# Patient Record
Sex: Female | Born: 1945 | Race: White | Hispanic: No | Marital: Married | Smoking: Never smoker
Health system: Southern US, Community
[De-identification: ages and names within clinical notes are randomized; demographics above are authoritative.]

## PROBLEM LIST (undated history)

## (undated) DIAGNOSIS — Z8489 Family history of other specified conditions: Secondary | ICD-10-CM

## (undated) DIAGNOSIS — C801 Malignant (primary) neoplasm, unspecified: Secondary | ICD-10-CM

## (undated) DIAGNOSIS — T8859XA Other complications of anesthesia, initial encounter: Secondary | ICD-10-CM

## (undated) DIAGNOSIS — T4145XA Adverse effect of unspecified anesthetic, initial encounter: Secondary | ICD-10-CM

## (undated) DIAGNOSIS — K219 Gastro-esophageal reflux disease without esophagitis: Secondary | ICD-10-CM

## (undated) DIAGNOSIS — T7840XA Allergy, unspecified, initial encounter: Secondary | ICD-10-CM

## (undated) HISTORY — DX: Malignant (primary) neoplasm, unspecified: C80.1

## (undated) HISTORY — DX: Gastro-esophageal reflux disease without esophagitis: K21.9

## (undated) HISTORY — DX: Allergy, unspecified, initial encounter: T78.40XA

---
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
## 1998-12-25 ENCOUNTER — Other Ambulatory Visit: Admission: RE | Admit: 1998-12-25 | Discharge: 1998-12-25 | Payer: Self-pay | Admitting: Obstetrics and Gynecology

## 2000-03-24 ENCOUNTER — Other Ambulatory Visit: Admission: RE | Admit: 2000-03-24 | Discharge: 2000-03-24 | Payer: Self-pay | Admitting: Obstetrics and Gynecology

## 2002-11-30 ENCOUNTER — Other Ambulatory Visit: Admission: RE | Admit: 2002-11-30 | Discharge: 2002-11-30 | Payer: Self-pay | Admitting: Obstetrics and Gynecology

## 2003-03-02 DIAGNOSIS — C801 Malignant (primary) neoplasm, unspecified: Secondary | ICD-10-CM

## 2003-03-02 HISTORY — DX: Malignant (primary) neoplasm, unspecified: C80.1

## 2003-11-22 HISTORY — PX: OTHER SURGICAL HISTORY: SHX169

## 2006-11-15 LAB — HM PAP SMEAR: HM Pap smear: NORMAL

## 2006-12-15 ENCOUNTER — Ambulatory Visit: Payer: Self-pay | Admitting: Gastroenterology

## 2006-12-15 LAB — HM COLONOSCOPY: HM Colonoscopy: NORMAL

## 2008-10-02 ENCOUNTER — Ambulatory Visit: Payer: Self-pay

## 2009-03-01 HISTORY — PX: EYE SURGERY: SHX253

## 2009-09-29 HISTORY — PX: CATARACT EXTRACTION: SUR2

## 2009-10-07 ENCOUNTER — Ambulatory Visit: Payer: Self-pay | Admitting: General Practice

## 2009-10-13 ENCOUNTER — Ambulatory Visit: Payer: Self-pay | Admitting: Ophthalmology

## 2010-10-13 ENCOUNTER — Ambulatory Visit: Payer: Self-pay | Admitting: General Practice

## 2010-11-15 LAB — HM MAMMOGRAPHY: HM Mammogram: NORMAL

## 2011-11-15 ENCOUNTER — Ambulatory Visit (INDEPENDENT_AMBULATORY_CARE_PROVIDER_SITE_OTHER): Payer: No Typology Code available for payment source | Admitting: Internal Medicine

## 2011-11-15 ENCOUNTER — Encounter: Payer: Self-pay | Admitting: Internal Medicine

## 2011-11-15 VITALS — BP 118/68 | HR 76 | Temp 98.5°F | Ht 62.0 in | Wt 154.8 lb

## 2011-11-15 DIAGNOSIS — M545 Low back pain: Secondary | ICD-10-CM

## 2011-11-15 DIAGNOSIS — E785 Hyperlipidemia, unspecified: Secondary | ICD-10-CM | POA: Insufficient documentation

## 2011-11-15 DIAGNOSIS — G8929 Other chronic pain: Secondary | ICD-10-CM

## 2011-11-15 DIAGNOSIS — Z13220 Encounter for screening for lipoid disorders: Secondary | ICD-10-CM

## 2011-11-15 DIAGNOSIS — Z0000 Encounter for general adult medical examination without abnormal findings: Secondary | ICD-10-CM | POA: Insufficient documentation

## 2011-11-15 DIAGNOSIS — Z1239 Encounter for other screening for malignant neoplasm of breast: Secondary | ICD-10-CM

## 2011-11-15 NOTE — Assessment & Plan Note (Signed)
Mild, with LDL 154 and HDL greater than 70 by recent fasting lipids. Review of past year show that the trend is in good direction with HDL increasing.

## 2011-11-15 NOTE — Assessment & Plan Note (Signed)
Her exam is normal except for overweight. She will return for annual GYN exam in 3 months .

## 2011-11-15 NOTE — Progress Notes (Signed)
Patient ID: Felicia Carroll, female   DOB: 06-Nov-1945, 66 y.o.   MRN: [id number]  Patient Active Problem List  Diagnosis  . Routine general medical examination at a health care facility  . Chronic low back pain  . Other and unspecified hyperlipidemia    Subjective:  CC:   Chief Complaint  Patient presents with  . Establish Care    HPI:   Felicia Carroll is a 66 y.o. female who presents as a new patient to establish primary care with the chief complaint of  Mild hyperlipidemia.,  Recent fasting labbs: HDL 47 LDL 156 trigs  111  Total 409.  Has a healthy diet, Walks regularly and gardens,  Keeps grandchildren on the weekends.  2) low back pain and left hip pain which has improved with chiropractic care. It is aggravated by her daytime activities. She Sits at desk all day typing.  Works at the [employer].  uses prn sudafed for hayfever,    Past Medical History  Diagnosis Date  . GERD (gastroesophageal reflux disease)   . Allergy   . Cancer 2005    ,  Olilla removed    Past Surgical History  Procedure Date  . Cataract extraction 8/ 2011  . Melanoma skin cancer 11/22/03  . Eye surgery 2011    cataract,  with lens implant    Family History  Problem Relation Age of Onset  . Cancer Mother   . Arthritis Maternal Grandmother   . Cancer Maternal Grandmother     History   Social History  . Marital Status: Married    Spouse Name: N/A    Number of Children: N/A  . Years of Education: N/A   Occupational History  . Not on file.   Social History Main Topics  . Smoking status: Never Smoker   . Smokeless tobacco: Not on file  . Alcohol Use: No  . Drug Use: No  . Sexually Active: Not on file   Other Topics Concern  . Not on file   Social History Narrative  . No narrative on file    No Known Allergies   Review of Systems:   The remainder of the review of systems was negative except those addressed in the HPI.   Objective:  BP 118/68  Pulse 76  Temp 98.5 F  (36.9 C) (Oral)  Ht 5\' 2"  (1.575 m)  Wt 154 lb 12 oz (70.194 kg)  BMI 28.30 kg/m2  SpO2 96%  General appearance: alert, cooperative and appears stated age Ears: normal TM's and external ear canals both ears Throat: lips, mucosa, and tongue normal; teeth and gums normal Neck: no adenopathy, no carotid bruit, supple, symmetrical, trachea midline and thyroid not enlarged, symmetric, no tenderness/mass/nodules Back: symmetric, no curvature. ROM normal. No CVA tenderness. Lungs: clear to auscultation bilaterally Heart: regular rate and rhythm, S1, S2 normal, no murmur, click, rub or gallop Abdomen: soft, non-tender; bowel sounds normal; no masses,  no organomegaly Pulses: 2+ and symmetric Skin: Skin color, texture, turgor normal. No rashes or lesions Lymph nodes: Cervical, supraclavicular, and axillary nodes normal.  Assessment and Plan:  Routine general medical examination at a health care facility Her exam is normal except for overweight. She will return for annual GYN exam in 3 months .  Screening for lipoid disorders Recent fasting lipids were normal. Her LDL was less than 160 and HDL was is over 70.  Chronic low back pain Nonradiating with prior chiropractic manipulation providing some relief. Pain is aggravated by prolonged  sitting. Reflexes are normal today. Urine nonsteroidal anti-inflammatories.  Other and unspecified hyperlipidemia Mild, with LDL 154 and HDL greater than 70 by recent fasting lipids. Review of past year show that the trend is in good direction with HDL increasing.   Updated Medication List Outpatient Encounter Prescriptions as of 11/15/2011  Medication Sig Dispense Refill  . Chlorpheniramine-Phenylephrine (SUDAFED PE SINUS/ALLERGY) 4-10 MG per tablet Take 1 tablet by mouth every 4 (four) hours as needed.         Orders Placed This Encounter  Procedures  . HM MAMMOGRAPHY  . HM DEXA SCAN  . MM Digital Screening  . HM PAP SMEAR  . HM COLONOSCOPY     Return in about 3 months (around 02/14/2012).

## 2011-11-15 NOTE — Assessment & Plan Note (Signed)
Recent fasting lipids were normal. Her LDL was less than 160 and HDL was is over 70.

## 2011-11-15 NOTE — Assessment & Plan Note (Signed)
Nonradiating with prior chiropractic manipulation providing some relief. Pain is aggravated by prolonged sitting. Reflexes are normal today. Urine nonsteroidal anti-inflammatories.

## 2011-12-01 ENCOUNTER — Ambulatory Visit: Payer: Self-pay | Admitting: General Practice

## 2011-12-07 ENCOUNTER — Encounter: Payer: Self-pay | Admitting: Internal Medicine

## 2012-02-16 ENCOUNTER — Other Ambulatory Visit (HOSPITAL_COMMUNITY)
Admission: RE | Admit: 2012-02-16 | Discharge: 2012-02-16 | Disposition: A | Discharge status: Home / Self Care | Payer: No Typology Code available for payment source | Source: Ambulatory Visit | Attending: Internal Medicine | Admitting: Internal Medicine

## 2012-02-16 ENCOUNTER — Encounter: Payer: Self-pay | Admitting: Internal Medicine

## 2012-02-16 ENCOUNTER — Ambulatory Visit (INDEPENDENT_AMBULATORY_CARE_PROVIDER_SITE_OTHER): Payer: No Typology Code available for payment source | Admitting: Internal Medicine

## 2012-02-16 VITALS — BP 118/66 | HR 67 | Temp 97.7°F | Resp 12 | Ht 62.0 in | Wt 155.2 lb

## 2012-02-16 DIAGNOSIS — Z0000 Encounter for general adult medical examination without abnormal findings: Secondary | ICD-10-CM

## 2012-02-16 DIAGNOSIS — E785 Hyperlipidemia, unspecified: Secondary | ICD-10-CM

## 2012-02-16 DIAGNOSIS — C801 Malignant (primary) neoplasm, unspecified: Secondary | ICD-10-CM | POA: Insufficient documentation

## 2012-02-16 DIAGNOSIS — Z124 Encounter for screening for malignant neoplasm of cervix: Secondary | ICD-10-CM

## 2012-02-16 DIAGNOSIS — Z1151 Encounter for screening for human papillomavirus (HPV): Secondary | ICD-10-CM | POA: Insufficient documentation

## 2012-02-16 DIAGNOSIS — Z01419 Encounter for gynecological examination (general) (routine) without abnormal findings: Secondary | ICD-10-CM | POA: Insufficient documentation

## 2012-02-16 NOTE — Progress Notes (Signed)
Patient ID: Felicia Carroll, female   DOB: 18-May-1945, 66 y.o.   MRN: [id number]  Subjective:     Felicia Carroll is a 66 y.o. female and is here for a comprehensive physical exam. The patient reports no problems. She was treated for dizziness by the office clinic with flonase used prn. Using Simply Saline daily .  Had fasting labs at the office clinic in December     History   Social History  . Marital Status: Married    Spouse Name: N/A    Number of Children: N/A  . Years of Education: N/A   Occupational History  . Not on file.   Social History Main Topics  . Smoking status: Never Smoker   . Smokeless tobacco: Not on file  . Alcohol Use: No  . Drug Use: No  . Sexually Active: Not on file   Other Topics Concern  . Not on file   Social History Narrative  . No narrative on file   Health Maintenance  Topic Date Due  . Tetanus/tdap  02/09/1967  . Zostavax  02/09/2008  . Pap Smear  11/14/2009  . Influenza Vaccine  10/31/2011  . Mammogram  12/16/2013  . Colonoscopy  11/14/2016    The following portions of the patient's history were reviewed and updated as appropriate: allergies, current medications, past family history, past medical history, past social history, past surgical history and problem list.  Review of Systems A comprehensive review of systems was negative.   Objective:        Assessment:    Healthy female exam. BP 118/66  Pulse 67  Temp 97.7 F (36.5 C) (Oral)  Resp 12  Ht 5\' 2"  (1.575 m)  Wt 155 lb 4 oz (70.421 kg)  BMI 28.40 kg/m2  SpO2 97%  General Appearance:    Alert, cooperative, no distress, appears stated age  Head:    Normocephalic, without obvious abnormality, atraumatic  Eyes:    PERRL, conjunctiva/corneas clear, EOM's intact, fundi    benign, both eyes  Ears:    Normal TM's and external ear canals, both ears  Nose:   Nares normal, septum midline, mucosa normal, no drainage    or sinus tenderness  Throat:   Lips, mucosa, and tongue normal;  teeth and gums normal  Neck:   Supple, symmetrical, trachea midline, no adenopathy;    thyroid:  no enlargement/tenderness/nodules; no carotid   bruit or JVD  Back:     Symmetric, no curvature, ROM normal, no CVA tenderness  Lungs:     Clear to auscultation bilaterally, respirations unlabored  Chest Wall:    No tenderness or deformity   Heart:    Regular rate and rhythm, S1 and S2 normal, no murmur, rub   or gallop  Breast Exam:    No tenderness, masses, or nipple abnormality  Abdomen:     Soft, non-tender, bowel sounds active all four quadrants,    no masses, no organomegaly  Genitalia:    Normal female without lesion, discharge or tenderness  Rectal:    Normal tone, normal prostate, no masses or tenderness;   guaiac negative stool  Extremities:   Extremities normal, atraumatic, no cyanosis or edema  Pulses:   2+ and symmetric all extremities  Skin:   Skin color, texture, turgor normal, no rashes or lesions  Lymph nodes:   Cervical, supraclavicular, and axillary nodes normal  Neurologic:   CNII-XII intact, normal strength, sensation and reflexes    throughout  Assessment and Plan  Routine general medical examination at a health care facility Breast pelvic and PAP smear done today.   Other and unspecified hyperlipidemia Mild, with elevated HDL,  No indication for medications.    Updated Medication List Outpatient Encounter Prescriptions as of 02/16/2012  Medication Sig Dispense Refill  . fluticasone (FLONASE) 50 MCG/ACT nasal spray Place 2 sprays into the nose daily.      . Chlorpheniramine-Phenylephrine (SUDAFED PE SINUS/ALLERGY) 4-10 MG per tablet Take 1 tablet by mouth every 4 (four) hours as needed.

## 2012-02-16 NOTE — Patient Instructions (Addendum)
Please ask your office to refax your recent labs  [phone] attn Dr Darrick Huntsman  Exercise goal : 30 minutes of aerobics 5 days week  This is  my version of a  "Low GI"  Diet:  All of the foods can be found at grocery stores and in bulk at [employer].  The Atkins protein bars and shakes are available in more varieties at Target, [employer] and [employer].     7 AM Breakfast:  Low carbohydrate Protein  Shakes (I recommend the EAS AdvantEdge "Carb Control" shakes  Or the low carb shakes by Atkins.   Both are available everywhere:  In  cases at [employer]Js  Or in 4 packs at grocery stores and pharmacies  2.5 carbs  (Alternative is  a toasted Arnold's Sandwhich Thin w/ peanut butter, a "Bagel Thin" with cream cheese and salmon) or  a scrambled egg burrito made with a low carb tortilla .  Avoid cereal and bananas, oatmeal too unless you are cooking the old fashioned kind that takes 30-40 minutes to prepare.  the rest is overly processed, has minimal fiber, and is loaded with carbohydrates!   10 AM: Protein bar by Atkins (the snack size, under 200 cal).  There are many varieties , available widely again or in bulk in limited varieties at BJs)  Other so called "protein bars" tend to be loaded with carbohydrates.  Remember, in food advertising, the word "energy" is synonymous for " carbohydrate."  Lunch: sandwich of [place], (or any lunchmeat, grilled meat or canned tuna), fresh avocado, mayonnaise  and cheese on a lower carbohydrate pita bread, flatbread, or tortilla . Ok to use regular mayonnaise. The bread is the only source or carbohydrate that can be decreased (Joseph's makes a pita bread and a flat bread that are 50 cal and 4 net carbs ; Toufayan makes a low carb flatbread that's 100 cal and 9 net carbs  and  Mission makes a low carb whole wheat tortilla  That is 210 cal and 6 net carbs)  3 PM:  Mid day :  Another protein bar,  Or a  cheese stick (100 cal, 0 carbs),  Or 1 ounce of  almonds, walnuts, pistachios, pecans,  peanuts,  Macadamia nuts. Or a Dannon light n Fit greek yogurt, 80 cal 8 net carbs . Avoid "granola"; the dried cranberries and raisins are loaded with carbohydrates. Mixed nuts ok if no raisins or cranberries or dried fruit.      6 PM  Dinner:  "mean and green:"  Meat/chicken/fish or a high protein legume; , with a green salad, and a low GI  Veggie (broccoli, cauliflower, green beans, spinach, brussel sprouts. Lima beans) : Avoid "Low fat dressings, as well as Reyne Dumas and [address]! They are loaded with sugar! Instead use ranch, vinagrette,  Blue cheese, etc.  There is a low carb pasta by Dreamfield's available at [facility] that is acceptable and tastes great. Try Michel Angel's chicken piccata over low carb pasta. The chicken dish is 0 carbs, and can be found in frozen section at BJs and Lowe's. Also try [employer] "Carnitas" (pulled pork, no sauce,  0 carbs) and his pot roast.   both are in the refrigerated section at BJs   9 PM snack : Breyer's "low carb" fudgsicle or  ice cream bar (Carb Smart line), or  Weight Watcher's ice cream bar , or another "no sugar added" ice cream;a serving of fresh berries/cherries with whipped cream (Avoid bananas,  pineapple, grapes  and watermelon on a regular basis because they are high in sugar)   Remember that snack Substitutions should be less than 15 to 20 carbs  Per serving. Remember to subtract fiber grams and sugar alcohols to get the "net carbs."

## 2012-02-20 ENCOUNTER — Encounter: Payer: Self-pay | Admitting: Internal Medicine

## 2012-02-20 NOTE — Assessment & Plan Note (Signed)
Mild, with elevated HDL,  No indication for medications.

## 2012-02-20 NOTE — Assessment & Plan Note (Signed)
Breast pelvic and PAP smear done today 

## 2012-02-23 ENCOUNTER — Telehealth: Payer: Self-pay | Admitting: Internal Medicine

## 2012-02-23 NOTE — Telephone Encounter (Signed)
I have received her recent labs.  The only abnormal was slightly elevated cholesterol, which does not need medication at this time. Repeat annually,  Try to follow a  Mediterranean diet. (she can google this phrase and read all about it)  Please also change her name in the system to "Christan" so we can find her more easily when things are sent in. thanks

## 2012-02-24 NOTE — Telephone Encounter (Signed)
Pt notified of the lab results.

## 2012-04-15 ENCOUNTER — Other Ambulatory Visit: Payer: Self-pay

## 2013-01-04 ENCOUNTER — Other Ambulatory Visit: Payer: Self-pay

## 2013-10-25 ENCOUNTER — Ambulatory Visit: Payer: Self-pay | Admitting: General Practice

## 2014-11-29 ENCOUNTER — Other Ambulatory Visit: Payer: Self-pay

## 2014-11-29 ENCOUNTER — Ambulatory Visit: Payer: Self-pay | Admitting: Physician Assistant

## 2014-11-29 ENCOUNTER — Other Ambulatory Visit: Payer: Self-pay | Admitting: Family Medicine

## 2014-11-29 ENCOUNTER — Encounter: Payer: Self-pay | Admitting: Physician Assistant

## 2014-11-29 VITALS — BP 120/70 | HR 71 | Temp 97.7°F

## 2014-11-29 DIAGNOSIS — Z0000 Encounter for general adult medical examination without abnormal findings: Secondary | ICD-10-CM

## 2014-11-29 NOTE — Addendum Note (Signed)
Addended by: Versie Starks on: 11/29/2014 02:31 PM   Modules accepted: Orders

## 2014-11-29 NOTE — Progress Notes (Signed)
S:  Pt here for yearly labs, hasn't seen pcp in a while, no medical hx, no complaints, ros neg; no baseline ekg in our chart, baseline ekg at [facility] is several years old, nonsmoker  O: vitals wnl,  General: No apparent distress    Ent:  tms clear, nasal mucosa pink, throat wnl, neck supple no lymph Lungs: Normal effort. Lungs are clear to auscultation, no crackles or wheezes Cardiovascular: Regular rate and rhythm, no edema Musculoskeletal:  Neurovascularly intact Neurological: No new neurological deficits  A: well adult   P: baseline ekg , fasting labs to include exec panel and vit d

## 2014-11-30 LAB — CMP12+LP+TP+TSH+6AC+CBC/D/PLT
A/G RATIO: 1.6 (ref 1.1–2.5)
ALK PHOS: 99 IU/L (ref 39–117)
ALT: 15 IU/L (ref 0–32)
AST: 22 IU/L (ref 0–40)
Albumin: 3.9 g/dL (ref 3.6–4.8)
BASOS ABS: 0 10*3/uL (ref 0.0–0.2)
BASOS: 0 %
BILIRUBIN TOTAL: 0.3 mg/dL (ref 0.0–1.2)
BUN / CREAT RATIO: 16 (ref 11–26)
BUN: 16 mg/dL (ref 8–27)
CHLORIDE: 101 mmol/L (ref 97–108)
Calcium: 9.4 mg/dL (ref 8.7–10.3)
Chol/HDL Ratio: 5.3 ratio units — ABNORMAL HIGH (ref 0.0–4.4)
Cholesterol, Total: 252 mg/dL — ABNORMAL HIGH (ref 100–199)
Creatinine, Ser: 1 mg/dL (ref 0.57–1.00)
EOS (ABSOLUTE): 0.1 10*3/uL (ref 0.0–0.4)
EOS: 2 %
Estimated CHD Risk: 1.4 times avg. — ABNORMAL HIGH (ref 0.0–1.0)
FREE THYROXINE INDEX: 2.3 (ref 1.2–4.9)
GFR calc non Af Amer: 59 mL/min/{1.73_m2} — ABNORMAL LOW (ref >59)
GFR, EST AFRICAN AMERICAN: 68 mL/min/{1.73_m2} (ref >59)
GGT: 12 IU/L (ref 0–60)
GLUCOSE: 88 mg/dL (ref 65–99)
Globulin, Total: 2.4 g/dL (ref 1.5–4.5)
HDL: 48 mg/dL (ref >39)
HEMATOCRIT: 38.5 % (ref 34.0–46.6)
HEMOGLOBIN: 12.6 g/dL (ref 11.1–15.9)
IMMATURE GRANS (ABS): 0 10*3/uL (ref 0.0–0.1)
IMMATURE GRANULOCYTES: 0 %
IRON: 77 ug/dL (ref 27–139)
LDH: 163 IU/L (ref 119–226)
LDL CALC: 183 mg/dL — AB (ref 0–99)
Lymphocytes Absolute: 2.3 10*3/uL (ref 0.7–3.1)
Lymphs: 40 %
MCH: 29.3 pg (ref 26.6–33.0)
MCHC: 32.7 g/dL (ref 31.5–35.7)
MCV: 90 fL (ref 79–97)
MONOCYTES: 8 %
Monocytes Absolute: 0.4 10*3/uL (ref 0.1–0.9)
NEUTROS PCT: 50 %
Neutrophils Absolute: 2.9 10*3/uL (ref 1.4–7.0)
PLATELETS: 219 10*3/uL (ref 150–379)
Phosphorus: 3.5 mg/dL (ref 2.5–4.5)
Potassium: 4.5 mmol/L (ref 3.5–5.2)
RBC: 4.3 x10E6/uL (ref 3.77–5.28)
RDW: 14.2 % (ref 12.3–15.4)
Sodium: 141 mmol/L (ref 134–144)
T3 UPTAKE RATIO: 26 % (ref 24–39)
T4, Total: 8.8 ug/dL (ref 4.5–12.0)
TOTAL PROTEIN: 6.3 g/dL (ref 6.0–8.5)
TSH: 3.55 u[IU]/mL (ref 0.450–4.500)
Triglycerides: 106 mg/dL (ref 0–149)
URIC ACID: 6 mg/dL (ref 2.5–7.1)
VLDL CHOLESTEROL CAL: 21 mg/dL (ref 5–40)
WBC: 5.8 10*3/uL (ref 3.4–10.8)

## 2014-11-30 LAB — VITAMIN D 25 HYDROXY (VIT D DEFICIENCY, FRACTURES): Vit D, 25-Hydroxy: 35.1 ng/mL (ref 30.0–100.0)

## 2016-03-01 HISTORY — PX: LUMBAR LAMINECTOMY: SHX95

## 2016-05-24 DIAGNOSIS — C44519 Basal cell carcinoma of skin of other part of trunk: Secondary | ICD-10-CM | POA: Diagnosis not present

## 2016-05-24 DIAGNOSIS — Z85828 Personal history of other malignant neoplasm of skin: Secondary | ICD-10-CM | POA: Diagnosis not present

## 2016-05-24 DIAGNOSIS — L578 Other skin changes due to chronic exposure to nonionizing radiation: Secondary | ICD-10-CM | POA: Diagnosis not present

## 2016-05-24 DIAGNOSIS — Z1283 Encounter for screening for malignant neoplasm of skin: Secondary | ICD-10-CM | POA: Diagnosis not present

## 2016-05-24 DIAGNOSIS — Z85820 Personal history of malignant melanoma of skin: Secondary | ICD-10-CM | POA: Diagnosis not present

## 2016-05-24 DIAGNOSIS — D485 Neoplasm of uncertain behavior of skin: Secondary | ICD-10-CM | POA: Diagnosis not present

## 2016-08-17 DIAGNOSIS — M545 Low back pain: Secondary | ICD-10-CM | POA: Diagnosis not present

## 2016-08-31 DIAGNOSIS — M5416 Radiculopathy, lumbar region: Secondary | ICD-10-CM | POA: Diagnosis not present

## 2016-08-31 DIAGNOSIS — M48061 Spinal stenosis, lumbar region without neurogenic claudication: Secondary | ICD-10-CM | POA: Diagnosis not present

## 2016-08-31 DIAGNOSIS — M4316 Spondylolisthesis, lumbar region: Secondary | ICD-10-CM | POA: Diagnosis not present

## 2016-08-31 DIAGNOSIS — M5126 Other intervertebral disc displacement, lumbar region: Secondary | ICD-10-CM | POA: Diagnosis not present

## 2016-08-31 DIAGNOSIS — M5136 Other intervertebral disc degeneration, lumbar region: Secondary | ICD-10-CM | POA: Diagnosis not present

## 2016-09-21 DIAGNOSIS — M4316 Spondylolisthesis, lumbar region: Secondary | ICD-10-CM | POA: Diagnosis not present

## 2016-09-22 DIAGNOSIS — C44519 Basal cell carcinoma of skin of other part of trunk: Secondary | ICD-10-CM | POA: Diagnosis not present

## 2016-09-29 ENCOUNTER — Encounter: Payer: Self-pay | Admitting: Physical Therapy

## 2016-09-29 ENCOUNTER — Ambulatory Visit: Payer: Medicare Other | Attending: Neurosurgery | Admitting: Physical Therapy

## 2016-09-29 ENCOUNTER — Ambulatory Visit: Payer: Medicare Other

## 2016-09-29 DIAGNOSIS — G8929 Other chronic pain: Secondary | ICD-10-CM | POA: Insufficient documentation

## 2016-09-29 DIAGNOSIS — M6281 Muscle weakness (generalized): Secondary | ICD-10-CM | POA: Diagnosis not present

## 2016-09-29 DIAGNOSIS — M5440 Lumbago with sciatica, unspecified side: Secondary | ICD-10-CM | POA: Insufficient documentation

## 2016-09-29 NOTE — Therapy (Signed)
[facility] [address], [place], [address] Phone: [phone]   Fax:  [phone]  Physical Therapy Evaluation  Patient Details  Name: Felicia Carroll MRN: [id number] Date of Birth: 05-Nov-1945 Referring Provider: Virginia Crews MD  Encounter Date: 09/29/2016      PT End of Session - 09/29/16 1000    Visit Number 1   Number of Visits 12   Date for PT Re-Evaluation 11/10/16   Authorization Type 1   Authorization Time Period 10 G code   PT Start Time 0905   PT Stop Time 1000   PT Time Calculation (min) 55 min   Activity Tolerance Patient tolerated treatment well   Behavior During Therapy [facility] for tasks assessed/performed      Past Medical History:  Diagnosis Date  . Allergy   . Cancer ([place]) 2005   melanoma  Olilla removed  . GERD (gastroesophageal reflux disease)     Past Surgical History:  Procedure Laterality Date  . CATARACT EXTRACTION  8/ 2011  . EYE SURGERY  2011   cataract,  with lens implant  . melanoma skin cancer  11/22/03    There were no vitals filed for this visit.       Subjective Assessment - 09/29/16 0920    Subjective Patient reports she is having lower back pain that limits prolonged standing; sitting is better than standing; repetitive bending aggravates back pain as well. 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 She also works    Pertinent History long history of back pain; stretching and exercising for self managment    Limitations Standing;Walking;House hold activities   Patient Stated Goals to be able to improve function with daily chores and walking for exercise   Currently in Pain? Yes   Pain Score 2    Pain Location Back   Pain Orientation Lower   Pain Descriptors / Indicators Aching;Dull   Pain Type Chronic pain   Pain Onset More than a month ago   Pain Frequency Constant   Aggravating Factors  walking, sitting   Pain  Relieving Factors rest   Effect of Pain on Daily Activities limits exercising, walking activities            [facility] PT Assessment - 09/29/16 2050      Assessment   Medical Diagnosis spinal stenosis of lumbar region, unspecified whether neurogenic claudicaiton present; Spondylolisthesis of lumbar region   Referring Provider Virginia Crews MD   Onset Date/Surgical Date 03/01/14   Prior Therapy none     Precautions   Precautions None     Balance Screen   Has the patient fallen in the past 6 months No     [place] residence   Living Arrangements Spouse/significant other   Type of [place] to enter   Entrance Stairs-Number of Steps 4   Entrance Stairs-[place]ails [place] One level     Prior Function   Level of Independence Independent   Vocation Retired   Leisure boating, gardening     Cognition   Overall Cognitive Status Within Functional Limits for tasks assessed     Observation/Other Assessments   Modified [place] 32% (moderate self perceived disability)     Sensation   Light Touch Appears Intact     Posture/Postural Control   Posture Comments guarded  ROM / Strength   AROM / PROM / Strength AROM;Strength     AROM   Overall AROM Comments lumbar spine: flexion WFL wihtout reproduction of pain, extension limited at least 50%; side bend right and left WFL and equal bilaterally; LE's hip flexion WNL, extension limited; ER mild limitations bilaterally,; knee flexion/extension WNL     Strength   Overall Strength Comments LE's: hip flexion right 5/5, left 4/5; knee extension right 5/5, left 4/5; hip extension 3+/5 bilateral.; ankle DF and great toe extenison, knee flexion equal bilaterally 5/5     FABER test   findings Negative   Comment bilateral     Straight Leg Raise   Findings Negative   Comment bilateral      Objective measurements completed on examination: See above findings.             PT Education - 09/29/16 0933    Education provided Yes   Education Details POC, body mechanics, exercises for stabilization in sitting   Person(s) Educated Patient   Methods Explanation;Demonstration;Verbal cues;Handout   Comprehension Verbalized understanding;Returned demonstration;Verbal cues required             PT Long Term Goals - 09/29/16 1048      PT LONG TERM GOAL #1   Title Patient will demonstrate improved function with daily tasks and decreased back pain as indicated by MODI score of  25% or less   Baseline MODI 32%   Status New   Target Date 11/10/16     PT LONG TERM GOAL #2   Title Patient will demonstrate improved posture awareness and pain control strategies to allow patient to stand for >30 min.    Baseline unable to stand >10 min   Status New   Target Date 11/10/16     PT LONG TERM GOAL #3   Title Patient will be independent with home program for posture awareness, pain control, progressive exercises to allow patient to transition to self management once discharged from physical therapy   Baseline limited to no knowledge of good body mechanics, posture awareness or exercise progression without guidance, cuing   Status New   Target Date 11/10/16                Plan - 09/29/16 1004    Clinical Impression Statement Patient is a 71 year old female who presents with chronic low back pain with left LE weakness and pain. She has limitations with walking and standing which limits ability to perform household chores and community activities. Her MODI score of 32% indicates moderate self perceived disabiltiy. She has no knowledge of appropirate pain control strategies, exercises or progression and will benefit from physical therapy intervention to improve function.    History and Personal Factors relevant to plan of care: long history of progressive worseing of low back pain with weakness in left LE   Clinical Presentation Evolving   Clinical  Presentation due to: worseingin symptoms over the past 2 years with pain radiating into left LE with weakness   Clinical Decision Making Moderate   Rehab Potential Fair   Clinical Impairments Affecting Rehab Potential (+)motivated(-)chronic condition, multiple comorbidities   PT Frequency 2x / week   PT Duration 6 weeks   PT Treatment/Interventions Electrical Stimulation;Cryotherapy;Moist Heat;Ultrasound;Patient/family education;Neuromuscular re-education;Therapeutic exercise;Manual techniques   PT Next Visit Plan pain control, manual therapy, progressive exercises   PT Home Exercise Plan body mechanics for daily tasks, stabilization sitting hip adduction with ball and glute sets; glute sets standing,  ball rolling under foot in sitting   Consulted and Agree with Plan of Care Patient      Patient will benefit from skilled therapeutic intervention in order to improve the following deficits and impairments:  Decreased strength, Improper body mechanics, Pain, Impaired perceived functional ability, Decreased activity tolerance, Increased muscle spasms, Difficulty walking, Decreased range of motion  Visit Diagnosis: Chronic low back pain with sciatica, sciatica laterality unspecified, unspecified back pain laterality - Plan: PT plan of care cert/re-cert  Muscle weakness (generalized) - Plan: PT plan of care cert/re-cert      G-Codes - 32/20/25 1000    Functional Assessment Tool Used (Outpatient Only) MODI, strength, ROM, pain, clinical judgment   Functional Limitation Mobility: Walking and moving around   Mobility: Walking and Moving Around Current Status ([id number]) At least 40 percent but less than 60 percent impaired, limited or restricted   Mobility: Walking and Moving Around Goal Status [phone]) At least 20 percent but less than 40 percent impaired, limited or restricted       Problem List Patient Active Problem List   Diagnosis Date Noted  . Cancer ([place])   . Routine general medical  examination at a health care facility 11/15/2011  . Chronic low back pain 11/15/2011  . Other and unspecified hyperlipidemia 11/15/2011    Jomarie Longs PT 09/30/2016, 11:04 AM  [facility] [address], [place], [address] Phone: [phone]   Fax:  [phone]  Name: Felicia Carroll MRN: [id number] Date of Birth: 14-May-1945

## 2016-10-06 ENCOUNTER — Encounter: Payer: No Typology Code available for payment source | Admitting: Physical Therapy

## 2016-10-07 ENCOUNTER — Encounter: Payer: Self-pay | Admitting: Physical Therapy

## 2016-10-07 ENCOUNTER — Ambulatory Visit: Payer: Medicare Other | Admitting: Physical Therapy

## 2016-10-07 DIAGNOSIS — G8929 Other chronic pain: Secondary | ICD-10-CM

## 2016-10-07 DIAGNOSIS — M6281 Muscle weakness (generalized): Secondary | ICD-10-CM

## 2016-10-07 DIAGNOSIS — M5440 Lumbago with sciatica, unspecified side: Secondary | ICD-10-CM

## 2016-10-07 NOTE — Therapy (Signed)
[facility] [address] [place], [address] Phone: [phone]   Fax:  [phone]  Physical Therapy Treatment  Patient Details  Name: Felicia Carroll MRN: [id number] Date of Birth: 09/14/1945 Referring Provider: Virginia Crews MD  Encounter Date: 10/07/2016      PT End of Session - 10/07/16 0910    Visit Number 2   Number of Visits 12   Date for PT Re-Evaluation 11/10/16   Authorization Type 2   Authorization Time Period 10 G code   PT Start Time 0903   PT Stop Time 0945   PT Time Calculation (min) 42 min   Activity Tolerance Patient tolerated treatment well   Behavior During Therapy [facility] for tasks assessed/performed      Past Medical History:  Diagnosis Date  . Allergy   . Cancer ([place]) 2005   melanoma  Olilla removed  . GERD (gastroesophageal reflux disease)     Past Surgical History:  Procedure Laterality Date  . CATARACT EXTRACTION  8/ 2011  . EYE SURGERY  2011   cataract,  with lens implant  . melanoma skin cancer  11/22/03    There were no vitals filed for this visit.      Subjective Assessment - 10/07/16 0904    Subjective Patient reports she is working on [employer] and had a long day yesterday and also did yard work and house work and her bac is sore from the activity. she is taking ibuprofen prior to bed and this is helping with her sleeping better.    Pertinent History long history of back pain; stretching and exercising for self managment    Limitations Standing;Walking;House hold activities   Patient Stated Goals to be able to improve function with daily chores and walking for exercise   Currently in Pain? Yes   Pain Score 3    Pain Location Back   Pain Orientation Lower;Right   Pain Descriptors / Indicators Aching;Dull   Pain Type Chronic pain   Pain Onset More than a month ago   Pain Frequency Constant     Objective: Posture: improved sitting posture with  more erect spine  Treatment: Therapeutic exercise: patient performed exercises with verbal, tactile cues and demonstration of therapist: goals: independent with home program, strength, improve function Sitting: Hip adduction with ball with glute sets x 10 reps, 3-5 second holds with VC for correct technique /sequence Hip abduction with green resistive band x 20 reps Standing: Resistive band, green, scapular rows x 15 reps high/mid rows Straight arm pull downs to hips green resistive band x 15 reps Side stepping along counter for UE support, green resistive band around thighs x 1 min. Diagonal march with 4# weights in hands x 1 min.  Modalities: Electrical stimulation: 15 min.: High volt estim.clincial program for muscle spasms  (4) electrodes applied to bilateral lumbar spine intensity to tolerance with patient prone lying over 1 pillow and LE's supported by pillow; moist heat applied in conjunction with estim. No adverse reactions noted: goal: pain, spasms  Patient response to treatment: patient demonstrated improved technique with exercises with minimal to moderate VC for correct alignment. Patient with decreased pain from   3/10 to <1 /10. Improved motor control with repetition and cuing          PT Education - 10/07/16 0908    Education provided Yes   Education Details re assessed home exercises, instruction in proper technique, sequence and alignment with  exercises: added diagonal walk, scapular rows, side stepping with resistive band   Person(s) Educated Patient   Methods Explanation;Demonstration;Verbal cues   Comprehension Verbalized understanding;Returned demonstration;Verbal cues required             PT Long Term Goals - 09/29/16 1048      PT LONG TERM GOAL #1   Title Patient will demonstrate improved function with daily tasks and decreased back pain as indicated by MODI score of  25% or less   Baseline MODI 32%   Status New   Target Date 11/10/16     PT LONG  TERM GOAL #2   Title Patient will demonstrate improved posture awareness and pain control strategies to allow patient to stand for >30 min.    Baseline unable to stand >10 min   Status New   Target Date 11/10/16     PT LONG TERM GOAL #3   Title Patient will be independent with home program for posture awareness, pain control, progressive exercises to allow patient to transition to self management once discharged from physical therapy   Baseline limited to no knowledge of good body mechanics, posture awareness or exercise progression without guidance, cuing   Status New   Target Date 11/10/16               Plan - 10/07/16 1017    Clinical Impression Statement Patient is progressing with exercises and knowledge of self managment of pain, body mechanics. She continues with increased pain with increased activity. She will benefit from additional physical therapy intervention to address limitations and achieve goals.    Rehab Potential Fair   Clinical Impairments Affecting Rehab Potential (+)motivated(-)chronic condition, multiple comorbidities   PT Frequency 2x / week   PT Duration 6 weeks   PT Treatment/Interventions Electrical Stimulation;Cryotherapy;Moist Heat;Ultrasound;Patient/family education;Neuromuscular re-education;Therapeutic exercise;Manual techniques   PT Next Visit Plan pain control, manual therapy, progressive exercises   PT Home Exercise Plan body mechanics for daily tasks, stabilization sitting hip adduction with ball and glute sets; glute sets standing, ball rolling under foot in sitting      Patient will benefit from skilled therapeutic intervention in order to improve the following deficits and impairments:  Decreased strength, Improper body mechanics, Pain, Impaired perceived functional ability, Decreased activity tolerance, Increased muscle spasms, Difficulty walking, Decreased range of motion  Visit Diagnosis: Muscle weakness (generalized)  Chronic low back  pain with sciatica, sciatica laterality unspecified, unspecified back pain laterality     Problem List Patient Active Problem List   Diagnosis Date Noted  . Cancer ([place])   . Routine general medical examination at a health care facility 11/15/2011  . Chronic low back pain 11/15/2011  . Other and unspecified hyperlipidemia 11/15/2011    Jomarie Longs PT 10/07/2016, 10:19 AM  [facility] [facility] [address] [place], [address] Phone: [phone]   Fax:  [phone]  Name: Felicia Carroll MRN: [id number] Date of Birth: Oct 30, 1945

## 2016-10-11 ENCOUNTER — Encounter: Payer: Self-pay | Admitting: Physical Therapy

## 2016-10-11 ENCOUNTER — Ambulatory Visit: Payer: Medicare Other | Admitting: Physical Therapy

## 2016-10-11 DIAGNOSIS — M5440 Lumbago with sciatica, unspecified side: Secondary | ICD-10-CM | POA: Diagnosis not present

## 2016-10-11 DIAGNOSIS — G8929 Other chronic pain: Secondary | ICD-10-CM

## 2016-10-11 DIAGNOSIS — M6281 Muscle weakness (generalized): Secondary | ICD-10-CM

## 2016-10-11 NOTE — Therapy (Signed)
[facility] [facility] [address] [place], [address] Phone: [phone]   Fax:  [phone]  Physical Therapy Treatment  Patient Details  Name: Felicia Carroll MRN: [id number] Date of Birth: Sep 04, 1945 Referring Provider: Virginia Crews MD  Encounter Date: 10/11/2016      PT End of Session - 10/11/16 1632    Visit Number 3   Number of Visits 12   Date for PT Re-Evaluation 11/10/16   Authorization Type 3   Authorization Time Period 10 G code   PT Start Time 1600   PT Stop Time 1640   PT Time Calculation (min) 40 min   Activity Tolerance Patient tolerated treatment well   Behavior During Therapy [facility] for tasks assessed/performed      Past Medical History:  Diagnosis Date  . Allergy   . Cancer ([place]) 2005   melanoma  Olilla removed  . GERD (gastroesophageal reflux disease)     Past Surgical History:  Procedure Laterality Date  . CATARACT EXTRACTION  8/ 2011  . EYE SURGERY  2011   cataract,  with lens implant  . melanoma skin cancer  11/22/03    There were no vitals filed for this visit.      Subjective Assessment - 10/11/16 1602    Subjective Patient reports she is having increased soreness in her hips/buttock region   Pertinent History long history of back pain; stretching and exercising for self managment    Limitations Standing;Walking;House hold activities   Patient Stated Goals to be able to improve function with daily chores and walking for exercise   Currently in Pain? Yes   Pain Score 5    Pain Location Back   Pain Orientation Lower   Pain Descriptors / Indicators Aching;Sore   Pain Type Chronic pain   Pain Onset More than a month ago   Pain Frequency Constant        Objective: Posture: improving posture awareness with sitting/standing  Treatment: Therapeutic exercise: patient performed exercises with verbal, tactile cues and demonstration of therapist: goals: independent  with home program, strength, improve function Sitting: Hip adduction with ball with glute sets x 10 reps, 3-5 second holds with VC for correct technique /sequence Hip abduction with green resistive band x 20 reps At [employer] cable exercise machine: Sitting on stability ball with base for support: Scapular rows bilateral 10# x 15 reps Straight arm pull downs 15# x 15 reps palloff press forward to abdomen 15# x 15 reps 4# weight stabilization bilateral forward flexion up over head x 10 Rhythmic stabilization with manual resistance of therapist flexion/extension x 10 reps, 5 second holds  Moist heat applied to back with patient seated at end of exercises for pain/spasms: unbilled time; no adverse reactions noted  Patient response to treatment: patient demonstrated improved motor control stabilization on ball with minimal cuing, repetition. Patient reported decreased soreness in lower back at end of session following moist heat.         PT Education - 10/11/16 1631    Education provided Yes   Education Details instruction in exercises with good technique, positioning   Person(s) Educated Patient   Methods Explanation;Demonstration;Verbal cues   Comprehension Verbalized understanding;Returned demonstration;Verbal cues required             PT Long Term Goals - 09/29/16 1048      PT LONG TERM GOAL #1   Title Patient will demonstrate improved function with daily tasks and decreased back  pain as indicated by MODI score of  25% or less   Baseline MODI 32%   Status New   Target Date 11/10/16     PT LONG TERM GOAL #2   Title Patient will demonstrate improved posture awareness and pain control strategies to allow patient to stand for >30 min.    Baseline unable to stand >10 min   Status New   Target Date 11/10/16     PT LONG TERM GOAL #3   Title Patient will be independent with home program for posture awareness, pain control, progressive exercises to allow patient to transition to  self management once discharged from physical therapy   Baseline limited to no knowledge of good body mechanics, posture awareness or exercise progression without guidance, cuing   Status New   Target Date 11/10/16               Plan - 10/11/16 1633    Clinical Impression Statement Patient is progressing with exercises and knowledge of self management. Patient demonstrates steady progress towards goals with improvement noted in ROM, strength, endurance.  Patient will benefit from continued physical therapy intervention to address limitations and achieve goals.   Rehab Potential Fair   Clinical Impairments Affecting Rehab Potential (+)motivated(-)chronic condition, multiple comorbidities   PT Frequency 2x / week   PT Duration 6 weeks   PT Treatment/Interventions Electrical Stimulation;Cryotherapy;Moist Heat;Ultrasound;Patient/family education;Neuromuscular re-education;Therapeutic exercise;Manual techniques   PT Next Visit Plan pain control, manual therapy, progressive exercises   PT Home Exercise Plan body mechanics for daily tasks, stabilization sitting hip adduction with ball and glute sets; glute sets standing, ball rolling under foot in sitting      Patient will benefit from skilled therapeutic intervention in order to improve the following deficits and impairments:  Decreased strength, Improper body mechanics, Pain, Impaired perceived functional ability, Decreased activity tolerance, Increased muscle spasms, Difficulty walking, Decreased range of motion  Visit Diagnosis: Muscle weakness (generalized)  Chronic low back pain with sciatica, sciatica laterality unspecified, unspecified back pain laterality     Problem List Patient Active Problem List   Diagnosis Date Noted  . Cancer ([place])   . Routine general medical examination at a health care facility 11/15/2011  . Chronic low back pain 11/15/2011  . Other and unspecified hyperlipidemia 11/15/2011    Jomarie Longs  PT 10/12/2016, 2:27 PM  [place] [facility] [address] [place], [address] Phone: [phone]   Fax:  [phone]  Name: Felicia Carroll MRN: [id number] Date of Birth: 08/31/1945

## 2016-10-13 ENCOUNTER — Encounter: Payer: No Typology Code available for payment source | Admitting: Physical Therapy

## 2016-10-14 ENCOUNTER — Ambulatory Visit: Payer: Medicare Other | Admitting: Physical Therapy

## 2016-10-18 ENCOUNTER — Ambulatory Visit: Payer: Medicare Other | Admitting: Physical Therapy

## 2016-10-18 ENCOUNTER — Encounter: Payer: Self-pay | Admitting: Physical Therapy

## 2016-10-18 DIAGNOSIS — M5440 Lumbago with sciatica, unspecified side: Secondary | ICD-10-CM | POA: Diagnosis not present

## 2016-10-18 DIAGNOSIS — G8929 Other chronic pain: Secondary | ICD-10-CM

## 2016-10-18 DIAGNOSIS — M6281 Muscle weakness (generalized): Secondary | ICD-10-CM | POA: Diagnosis not present

## 2016-10-19 ENCOUNTER — Ambulatory Visit: Payer: Medicare Other | Admitting: Physical Therapy

## 2016-10-19 NOTE — Therapy (Signed)
[place] [address] [place], [address] Phone: [phone]   Fax:  [phone]  Physical Therapy Treatment  Patient Details  Name: Felicia Carroll MRN: [id number] Date of Birth: [birth year] Referring Provider: Virginia Crews MD  Encounter Date: 10/18/2016      PT End of Session - 10/18/16 1536    Visit Number 4   Number of Visits 12   Date for PT Re-Evaluation 11/10/16   Authorization Type 4   Authorization Time Period 10 G code   PT Start Time 1435   PT Stop Time 1513   PT Time Calculation (min) 38 min   Activity Tolerance Patient tolerated treatment well   Behavior During Therapy [facility] for tasks assessed/performed      Past Medical History:  Diagnosis Date  . Allergy   . Cancer ([place]) 2005   melanoma  Olilla removed  . GERD (gastroesophageal reflux disease)     Past Surgical History:  Procedure Laterality Date  . CATARACT EXTRACTION  8/ 2011  . EYE SURGERY  2011   cataract,  with lens implant  . melanoma skin cancer  11/22/03    There were no vitals filed for this visit.      Subjective Assessment - 10/18/16 1435    Subjective Patient reports she is still having pain in hips/buttock region.    Pertinent History long history of back pain; stretching and exercising for self managment    Limitations Standing;Walking;House hold activities   Patient Stated Goals to be able to improve function with daily chores and walking for exercise   Currently in Pain? Yes   Pain Score 4    Pain Location Back   Pain Orientation Lower   Pain Descriptors / Indicators Aching;Sore   Pain Type Chronic pain   Pain Onset More than a month ago   Pain Frequency Intermittent        Objective: Posture: improving posture awareness with sitting/standing  Treatment: Therapeutic exercise: patient performed exercises with verbal, tactile cues and demonstration of therapist: goals: independent with  home program, strength, improve function Sitting: Hip adduction with ball withglute sets x 10 reps, 3-5 second holds with VC for correct technique /sequence Isometric hip flexion each LE 3 reps 5 - 10 second holds  Supine lying:  Bridging with towel roll under lumbar spine, ball between knees, partial ROM without pain x 10 reps Hook lying with 45cm ball isometric hip flexion each LE x 5 reps 5 second holds  Standing: at wall isometric hip exercises Hip extension each 10 reps 5 second holds Hip abduction with hip in alignment with opposite LE press into wall 5 seconds 10 reps Hip flexion each LE press into pillow 5 second holds x 5 reps each  Standing low rows with elastic band isometric hold with staggered stance; green resistive band pull back to hips and hold 10 seconds, 3 reps performed with VC  Patient response to treatment: patient demonstrated improved posture, alignment and technique with all exercises following repetition, demonstration and with moderate VC. Mild soreness, no pain reported at end of session.        PT Education - 10/18/16 1455    Education provided Yes   Education Details exercise instruction   Person(s) Educated Patient   Methods Explanation;Demonstration;Verbal cues;Handout   Comprehension Verbalized understanding;Returned demonstration;Verbal cues required             PT Long Term Goals - 09/29/16 1048  PT LONG TERM GOAL #1   Title Patient will demonstrate improved function with daily tasks and decreased back pain as indicated by MODI score of  25% or less   Baseline MODI 32%   Status New   Target Date 11/10/16     PT LONG TERM GOAL #2   Title Patient will demonstrate improved posture awareness and pain control strategies to allow patient to stand for >30 min.    Baseline unable to stand >10 min   Status New   Target Date 11/10/16     PT LONG TERM GOAL #3   Title Patient will be independent with home program for posture awareness,  pain control, progressive exercises to allow patient to transition to self management once discharged from physical therapy   Baseline limited to no knowledge of good body mechanics, posture awareness or exercise progression without guidance, cuing   Status New   Target Date 11/10/16               Plan - 10/18/16 1537    Clinical Impression Statement Patient is progressing with exercises and knowledge of exercises with moderate cuing and demonstration. Patient demonstrates steady progress towards goals with improvement noted in strength, endurance. Patient will benefit from continued physical therapy intervention to address limitations and achieve goals.    Rehab Potential Fair   Clinical Impairments Affecting Rehab Potential (+)motivated(-)chronic condition, multiple comorbidities   PT Frequency 2x / week   PT Duration 6 weeks   PT Treatment/Interventions Electrical Stimulation;Cryotherapy;Moist Heat;Ultrasound;Patient/family education;Neuromuscular re-education;Therapeutic exercise;Manual techniques   PT Next Visit Plan pain control, manual therapy, progressive exercises   PT Home Exercise Plan body mechanics for daily tasks, stabilization sitting hip adduction with ball and glute sets; glute sets standing, ball rolling under foot in sitting; isometric hip exercises      Patient will benefit from skilled therapeutic intervention in order to improve the following deficits and impairments:  Decreased strength, Improper body mechanics, Pain, Impaired perceived functional ability, Decreased activity tolerance, Increased muscle spasms, Difficulty walking, Decreased range of motion  Visit Diagnosis: Muscle weakness (generalized)  Chronic low back pain with sciatica, sciatica laterality unspecified, unspecified back pain laterality     Problem List Patient Active Problem List   Diagnosis Date Noted  . Cancer ([place])   . Routine general medical examination at a health care facility  11/15/2011  . Chronic low back pain 11/15/2011  . Other and unspecified hyperlipidemia 11/15/2011    Jomarie Longs PT 10/19/2016, 6:18 PM  [place] [facility] [address], [place], [address] Phone: [phone]   Fax:  [phone]  Name: Felicia Carroll MRN: [id number] Date of Birth: Feb 02, 1946

## 2016-10-26 ENCOUNTER — Encounter: Payer: Medicare Other | Admitting: Physical Therapy

## 2016-10-26 DIAGNOSIS — M48061 Spinal stenosis, lumbar region without neurogenic claudication: Secondary | ICD-10-CM | POA: Diagnosis not present

## 2016-10-28 ENCOUNTER — Ambulatory Visit: Payer: Medicare Other | Admitting: Physical Therapy

## 2016-11-26 DIAGNOSIS — M4316 Spondylolisthesis, lumbar region: Secondary | ICD-10-CM | POA: Diagnosis not present

## 2016-11-26 DIAGNOSIS — Z4789 Encounter for other orthopedic aftercare: Secondary | ICD-10-CM | POA: Diagnosis not present

## 2016-11-26 DIAGNOSIS — Z981 Arthrodesis status: Secondary | ICD-10-CM | POA: Diagnosis not present

## 2016-11-26 DIAGNOSIS — M5416 Radiculopathy, lumbar region: Secondary | ICD-10-CM | POA: Diagnosis not present

## 2016-11-26 DIAGNOSIS — K219 Gastro-esophageal reflux disease without esophagitis: Secondary | ICD-10-CM | POA: Diagnosis not present

## 2016-11-26 DIAGNOSIS — M48061 Spinal stenosis, lumbar region without neurogenic claudication: Secondary | ICD-10-CM | POA: Diagnosis not present

## 2017-01-25 DIAGNOSIS — M47816 Spondylosis without myelopathy or radiculopathy, lumbar region: Secondary | ICD-10-CM | POA: Diagnosis not present

## 2017-01-25 DIAGNOSIS — M4310 Spondylolisthesis, site unspecified: Secondary | ICD-10-CM | POA: Diagnosis not present

## 2017-01-25 DIAGNOSIS — M5136 Other intervertebral disc degeneration, lumbar region: Secondary | ICD-10-CM | POA: Diagnosis not present

## 2017-03-29 DIAGNOSIS — M436 Torticollis: Secondary | ICD-10-CM | POA: Diagnosis not present

## 2017-03-29 DIAGNOSIS — M5386 Other specified dorsopathies, lumbar region: Secondary | ICD-10-CM | POA: Diagnosis not present

## 2017-03-29 DIAGNOSIS — M9901 Segmental and somatic dysfunction of cervical region: Secondary | ICD-10-CM | POA: Diagnosis not present

## 2017-03-29 DIAGNOSIS — M9903 Segmental and somatic dysfunction of lumbar region: Secondary | ICD-10-CM | POA: Diagnosis not present

## 2017-04-12 DIAGNOSIS — M436 Torticollis: Secondary | ICD-10-CM | POA: Diagnosis not present

## 2017-04-12 DIAGNOSIS — M9901 Segmental and somatic dysfunction of cervical region: Secondary | ICD-10-CM | POA: Diagnosis not present

## 2017-04-12 DIAGNOSIS — M5442 Lumbago with sciatica, left side: Secondary | ICD-10-CM | POA: Diagnosis not present

## 2017-04-12 DIAGNOSIS — M9903 Segmental and somatic dysfunction of lumbar region: Secondary | ICD-10-CM | POA: Diagnosis not present

## 2017-05-03 DIAGNOSIS — M9901 Segmental and somatic dysfunction of cervical region: Secondary | ICD-10-CM | POA: Diagnosis not present

## 2017-05-03 DIAGNOSIS — M531 Cervicobrachial syndrome: Secondary | ICD-10-CM | POA: Diagnosis not present

## 2017-05-03 DIAGNOSIS — M9903 Segmental and somatic dysfunction of lumbar region: Secondary | ICD-10-CM | POA: Diagnosis not present

## 2017-05-03 DIAGNOSIS — M5442 Lumbago with sciatica, left side: Secondary | ICD-10-CM | POA: Diagnosis not present

## 2017-05-23 DIAGNOSIS — Z85820 Personal history of malignant melanoma of skin: Secondary | ICD-10-CM | POA: Diagnosis not present

## 2017-05-23 DIAGNOSIS — L821 Other seborrheic keratosis: Secondary | ICD-10-CM | POA: Diagnosis not present

## 2017-05-23 DIAGNOSIS — Z1283 Encounter for screening for malignant neoplasm of skin: Secondary | ICD-10-CM | POA: Diagnosis not present

## 2017-05-23 DIAGNOSIS — D485 Neoplasm of uncertain behavior of skin: Secondary | ICD-10-CM | POA: Diagnosis not present

## 2017-05-23 DIAGNOSIS — L820 Inflamed seborrheic keratosis: Secondary | ICD-10-CM | POA: Diagnosis not present

## 2017-11-01 DIAGNOSIS — Z961 Presence of intraocular lens: Secondary | ICD-10-CM | POA: Diagnosis not present

## 2018-05-11 ENCOUNTER — Encounter: Payer: Self-pay | Admitting: Obstetrics and Gynecology

## 2018-05-22 DIAGNOSIS — Z85828 Personal history of other malignant neoplasm of skin: Secondary | ICD-10-CM | POA: Diagnosis not present

## 2018-05-22 DIAGNOSIS — D229 Melanocytic nevi, unspecified: Secondary | ICD-10-CM | POA: Diagnosis not present

## 2018-05-22 DIAGNOSIS — D485 Neoplasm of uncertain behavior of skin: Secondary | ICD-10-CM | POA: Diagnosis not present

## 2018-05-22 DIAGNOSIS — Z1283 Encounter for screening for malignant neoplasm of skin: Secondary | ICD-10-CM | POA: Diagnosis not present

## 2018-05-22 DIAGNOSIS — Z85820 Personal history of malignant melanoma of skin: Secondary | ICD-10-CM | POA: Diagnosis not present

## 2018-11-10 ENCOUNTER — Other Ambulatory Visit: Payer: Self-pay | Admitting: Obstetrics & Gynecology

## 2019-03-20 DIAGNOSIS — Z85820 Personal history of malignant melanoma of skin: Secondary | ICD-10-CM | POA: Diagnosis not present

## 2019-03-20 DIAGNOSIS — Z79899 Other long term (current) drug therapy: Secondary | ICD-10-CM | POA: Diagnosis not present

## 2019-03-20 DIAGNOSIS — E538 Deficiency of other specified B group vitamins: Secondary | ICD-10-CM | POA: Diagnosis not present

## 2019-03-20 DIAGNOSIS — Z0000 Encounter for general adult medical examination without abnormal findings: Secondary | ICD-10-CM | POA: Diagnosis not present

## 2019-03-20 DIAGNOSIS — M5186 Other intervertebral disc disorders, lumbar region: Secondary | ICD-10-CM | POA: Diagnosis not present

## 2019-03-26 DIAGNOSIS — Z1212 Encounter for screening for malignant neoplasm of rectum: Secondary | ICD-10-CM | POA: Diagnosis not present

## 2019-03-26 DIAGNOSIS — E538 Deficiency of other specified B group vitamins: Secondary | ICD-10-CM | POA: Diagnosis not present

## 2019-03-26 DIAGNOSIS — Z0000 Encounter for general adult medical examination without abnormal findings: Secondary | ICD-10-CM | POA: Diagnosis not present

## 2019-03-26 DIAGNOSIS — Z79899 Other long term (current) drug therapy: Secondary | ICD-10-CM | POA: Diagnosis not present

## 2019-03-27 DIAGNOSIS — R6889 Other general symptoms and signs: Secondary | ICD-10-CM | POA: Diagnosis not present

## 2019-04-11 DIAGNOSIS — E785 Hyperlipidemia, unspecified: Secondary | ICD-10-CM | POA: Diagnosis not present

## 2019-04-11 DIAGNOSIS — I6523 Occlusion and stenosis of bilateral carotid arteries: Secondary | ICD-10-CM | POA: Diagnosis not present

## 2019-04-23 ENCOUNTER — Telehealth: Payer: Self-pay

## 2019-04-23 ENCOUNTER — Other Ambulatory Visit: Payer: Self-pay

## 2019-04-23 DIAGNOSIS — R6889 Other general symptoms and signs: Secondary | ICD-10-CM | POA: Diagnosis not present

## 2019-04-23 DIAGNOSIS — R195 Other fecal abnormalities: Secondary | ICD-10-CM

## 2019-04-23 NOTE — Telephone Encounter (Signed)
Gastroenterology Pre-Procedure Review  Request Date: Monday 05/21/19 Requesting Physician: Dr. Allen Norris  PATIENT REVIEW QUESTIONS: The patient responded to the following health history questions as indicated:    1. Are you having any GI issues? yes (Fecal abnormality on stool card ) 2. Do you have a personal history of Polyps? no 3. Do you have a family history of Colon Cancer or Polyps? no 4. Diabetes Mellitus? no 5. Joint replacements in the past 12 months?no 6. Major health problems in the past 3 months?no 7. Any artificial heart valves, MVP, or defibrillator?no    MEDICATIONS & ALLERGIES:    Patient reports the following regarding taking any anticoagulation/antiplatelet therapy:   Plavix, Coumadin, Eliquis, Xarelto, Lovenox, Pradaxa, Brilinta, or Effient? no Aspirin? no  Patient confirms/reports the following medications:  Current Outpatient Medications  Medication Sig Dispense Refill  . Chlorpheniramine-Phenylephrine (SUDAFED PE SINUS/ALLERGY) 4-10 MG per tablet Take 1 tablet by mouth every 4 (four) hours as needed.    . fluticasone (FLONASE) 50 MCG/ACT nasal spray Place 2 sprays into the nose daily.     No current facility-administered medications for this visit.    Patient confirms/reports the following allergies:  No Known Allergies  No orders of the defined types were placed in this encounter.   AUTHORIZATION INFORMATION Primary Insurance: 1D#: Group #:  Secondary Insurance: 1D#: Group #:  SCHEDULE INFORMATION: Date: Monday 05/21/19 Time: Location:[facility]

## 2019-05-15 ENCOUNTER — Other Ambulatory Visit: Payer: Self-pay

## 2019-05-15 ENCOUNTER — Encounter: Payer: Self-pay | Admitting: Gastroenterology

## 2019-05-17 ENCOUNTER — Other Ambulatory Visit
Admission: RE | Admit: 2019-05-17 | Discharge: 2019-05-17 | Disposition: A | Discharge status: Home / Self Care | Payer: Medicare Other | Source: Ambulatory Visit | Attending: Gastroenterology | Admitting: Gastroenterology

## 2019-05-17 DIAGNOSIS — Z01812 Encounter for preprocedural laboratory examination: Secondary | ICD-10-CM | POA: Insufficient documentation

## 2019-05-17 DIAGNOSIS — Z20822 Contact with and (suspected) exposure to covid-19: Secondary | ICD-10-CM | POA: Diagnosis not present

## 2019-05-17 LAB — SARS CORONAVIRUS 2 (TAT 6-24 HRS): SARS Coronavirus 2: NEGATIVE

## 2019-05-18 NOTE — Discharge Instructions (Signed)
General Anesthesia, Adult, Care After This sheet gives you information about how to care for yourself after your procedure. Your health care provider may also give you more specific instructions. If you have problems or questions, contact your health care provider. What can I expect after the procedure? After the procedure, the following side effects are common:  Pain or discomfort at the IV site.  Nausea.  Vomiting.  Sore throat.  Trouble concentrating.  Feeling cold or chills.  Weak or tired.  Sleepiness and fatigue.  Soreness and body aches. These side effects can affect parts of the body that were not involved in surgery. Follow these instructions at home:  For at least 24 hours after the procedure:  Have a responsible adult stay with you. It is important to have someone help care for you until you are awake and alert.  Rest as needed.  Do not: ? Participate in activities in which you could fall or become injured. ? Drive. ? Use heavy machinery. ? Drink alcohol. ? Take sleeping pills or medicines that cause drowsiness. ? Make important decisions or sign legal documents. ? Take care of children on your own. Eating and drinking  Follow any instructions from your health care provider about eating or drinking restrictions.  When you feel hungry, start by eating small amounts of foods that are soft and easy to digest (bland), such as toast. Gradually return to your regular diet.  Drink enough fluid to keep your urine pale yellow.  If you vomit, rehydrate by drinking water, juice, or clear broth. General instructions  If you have sleep apnea, surgery and certain medicines can increase your risk for breathing problems. Follow instructions from your health care provider about wearing your sleep device: ? Anytime you are sleeping, including during daytime naps. ? While taking prescription pain medicines, sleeping medicines, or medicines that make you drowsy.  Return to  your normal activities as told by your health care provider. Ask your health care provider what activities are safe for you.  Take over-the-counter and prescription medicines only as told by your health care provider.  If you smoke, do not smoke without supervision.  Keep all follow-up visits as told by your health care provider. This is important. Contact a health care provider if:  You have nausea or vomiting that does not get better with medicine.  You cannot eat or drink without vomiting.  You have pain that does not get better with medicine.  You are unable to pass urine.  You develop a skin rash.  You have a fever.  You have redness around your IV site that gets worse. Get help right away if:  You have difficulty breathing.  You have chest pain.  You have blood in your urine or stool, or you vomit blood. Summary  After the procedure, it is common to have a sore throat or nausea. It is also common to feel tired.  Have a responsible adult stay with you for the first 24 hours after general anesthesia. It is important to have someone help care for you until you are awake and alert.  When you feel hungry, start by eating small amounts of foods that are soft and easy to digest (bland), such as toast. Gradually return to your regular diet.  Drink enough fluid to keep your urine pale yellow.  Return to your normal activities as told by your health care provider. Ask your health care provider what activities are safe for you. This information is not   intended to replace advice given to you by your health care provider. Make sure you discuss any questions you have with your health care provider. Document Revised: 02/18/2017 Document Reviewed: 10/01/2016 Elsevier Patient Education  2020 [employer]  

## 2019-05-21 ENCOUNTER — Ambulatory Visit: Payer: Medicare Other | Admitting: Anesthesiology

## 2019-05-21 ENCOUNTER — Encounter
Admission: RE | Disposition: A | Discharge status: Home / Self Care | Payer: Self-pay | Source: Home / Self Care | Attending: Gastroenterology

## 2019-05-21 ENCOUNTER — Encounter: Payer: Self-pay | Admitting: Gastroenterology

## 2019-05-21 ENCOUNTER — Ambulatory Visit
Admission: RE | Admit: 2019-05-21 | Discharge: 2019-05-21 | Disposition: A | Discharge status: Home / Self Care | Payer: Medicare Other | Attending: Gastroenterology | Admitting: Gastroenterology

## 2019-05-21 ENCOUNTER — Other Ambulatory Visit: Payer: Self-pay

## 2019-05-21 DIAGNOSIS — Z85820 Personal history of malignant melanoma of skin: Secondary | ICD-10-CM | POA: Insufficient documentation

## 2019-05-21 DIAGNOSIS — K648 Other hemorrhoids: Secondary | ICD-10-CM | POA: Insufficient documentation

## 2019-05-21 DIAGNOSIS — K219 Gastro-esophageal reflux disease without esophagitis: Secondary | ICD-10-CM | POA: Diagnosis not present

## 2019-05-21 DIAGNOSIS — Z7982 Long term (current) use of aspirin: Secondary | ICD-10-CM | POA: Diagnosis not present

## 2019-05-21 DIAGNOSIS — K5730 Diverticulosis of large intestine without perforation or abscess without bleeding: Secondary | ICD-10-CM | POA: Diagnosis not present

## 2019-05-21 DIAGNOSIS — R195 Other fecal abnormalities: Secondary | ICD-10-CM | POA: Diagnosis not present

## 2019-05-21 DIAGNOSIS — Z79899 Other long term (current) drug therapy: Secondary | ICD-10-CM | POA: Diagnosis not present

## 2019-05-21 HISTORY — DX: Other complications of anesthesia, initial encounter: T88.59XA

## 2019-05-21 HISTORY — PX: COLONOSCOPY WITH PROPOFOL: SHX5780

## 2019-05-21 HISTORY — DX: Family history of other specified conditions: Z84.89

## 2019-05-21 SURGERY — COLONOSCOPY WITH PROPOFOL
Anesthesia: General | Site: Rectum

## 2019-05-21 MED ORDER — ACETAMINOPHEN 325 MG PO TABS: 325.0000 mg | ORAL_TABLET | ORAL | Status: DC | PRN

## 2019-05-21 MED ORDER — LACTATED RINGERS IV SOLN: INTRAVENOUS | Status: DC | PRN

## 2019-05-21 MED ORDER — ONDANSETRON HCL 4 MG/2ML IJ SOLN: 4.0000 mg | Freq: Once | INTRAMUSCULAR | Status: DC | PRN

## 2019-05-21 MED ORDER — PROPOFOL 10 MG/ML IV BOLUS
INTRAVENOUS | Status: DC | PRN
  Administered 2019-05-21: 50 mg via INTRAVENOUS
  Administered 2019-05-21: 100 mg via INTRAVENOUS
  Administered 2019-05-21: 50 mg via INTRAVENOUS
  Administered 2019-05-21: 20 mg via INTRAVENOUS
  Administered 2019-05-21: 50 mg via INTRAVENOUS

## 2019-05-21 MED ORDER — OXYCODONE HCL 5 MG PO TABS: 5.0000 mg | ORAL_TABLET | Freq: Once | ORAL | Status: DC | PRN

## 2019-05-21 MED ORDER — LIDOCAINE HCL (CARDIAC) PF 100 MG/5ML IV SOSY
PREFILLED_SYRINGE | INTRAVENOUS | Status: DC | PRN
  Administered 2019-05-21: 30 mg via INTRAVENOUS

## 2019-05-21 MED ORDER — STERILE WATER FOR IRRIGATION IR SOLN
Status: DC | PRN
  Administered 2019-05-21: 200 mL

## 2019-05-21 MED ORDER — ACETAMINOPHEN 160 MG/5ML PO SOLN: 325.0000 mg | ORAL | Status: DC | PRN

## 2019-05-21 MED ORDER — OXYCODONE HCL 5 MG/5ML PO SOLN: 5.0000 mg | Freq: Once | ORAL | Status: DC | PRN

## 2019-05-21 SURGICAL SUPPLY — 24 items

## 2019-05-21 NOTE — Op Note (Signed)
[facility] Gastroenterology Patient Name: Felicia Carroll Procedure Date: 05/21/2019 11:14 AM MRN: [id number] Account #: [id number] Date of Birth: [birth year] Admit Type: Outpatient Age: 74 Room: [facility] OR ROOM 01 Gender: Female Note Status: Finalized Procedure:             Colonoscopy Indications:           Positive Cologuard test Providers:             Lucilla Lame MD, MD Referring MD:          Rusty Aus, MD (Referring MD) Medicines:             Monitored Anesthesia Care, Propofol per Anesthesia Complications:         No immediate complications. Procedure:             Pre-Anesthesia Assessment:                        - Prior to the procedure, a History and Physical was                         performed, and patient medications and allergies were                         reviewed. The patient's tolerance of previous                         anesthesia was also reviewed. The risks and benefits                         of the procedure and the sedation options and risks                         were discussed with the patient. All questions were                         answered, and informed consent was obtained. Prior                         Anticoagulants: The patient has taken no previous                         anticoagulant or antiplatelet agents. ASA Grade                         Assessment: II - A patient with mild systemic disease.                         After reviewing the risks and benefits, the patient                         was deemed in satisfactory condition to undergo the                         procedure.                        After obtaining informed consent, the colonoscope was  passed under direct vision. Throughout the procedure,                         the patient's blood pressure, pulse, and oxygen                         saturations were monitored continuously. The                         Colonoscope was introduced  through the anus and                         advanced to the the cecum, identified by appendiceal                         orifice and ileocecal valve. The colonoscopy was                         performed without difficulty. The patient tolerated                         the procedure well. The quality of the bowel                         preparation was excellent. Findings:      The perianal and digital rectal examinations were normal.      Non-bleeding internal hemorrhoids were found during retroflexion. The       hemorrhoids were Grade I (internal hemorrhoids that do not prolapse).      A few small-mouthed diverticula were found in the sigmoid colon. Impression:            - Non-bleeding internal hemorrhoids.                        - Diverticulosis in the sigmoid colon.                        - No specimens collected. Recommendation:        - Discharge patient to home.                        - Resume previous diet.                        - Continue present medications. Procedure Code(s):     --- Professional ---                        [phone], Colonoscopy, flexible; diagnostic, including                         collection of specimen(s) by brushing or washing, when                         performed (separate procedure) Diagnosis Code(s):     --- Professional ---                        R19.5, Other fecal abnormalities CPT copyright 2019 American Medical Association. All rights reserved. The codes documented in this report are preliminary and upon coder  review may  be revised to meet current compliance requirements. Lucilla Lame MD, MD 05/21/2019 11:41:02 AM This report has been signed electronically. Number of Addenda: 0 Note Initiated On: 05/21/2019 11:14 AM Scope Withdrawal Time: 0 hours 6 minutes 45 seconds  Total Procedure Duration: 0 hours 9 minutes 56 seconds  Estimated Blood Loss:  Estimated blood loss: none.      [facility]

## 2019-05-21 NOTE — Anesthesia Procedure Notes (Signed)
Procedure Name: MAC Date/Time: 05/21/2019 11:28 AM Performed by: Georga Bora, CRNA Pre-anesthesia Checklist: Emergency Drugs available, Patient identified, Suction available, Patient being monitored and Timeout performed Oxygen Delivery Method: Nasal cannula

## 2019-05-21 NOTE — Anesthesia Postprocedure Evaluation (Signed)
Anesthesia Post Note  Patient: Felicia Carroll  Procedure(s) Performed: COLONOSCOPY WITH PROPOFOL (N/A Rectum)     Patient location during evaluation: PACU Anesthesia Type: General Level of consciousness: awake and alert Pain management: pain level controlled Vital Signs Assessment: post-procedure vital signs reviewed and stable Respiratory status: spontaneous breathing Cardiovascular status: stable Anesthetic complications: no    Jaci Standard, III,  Alazne Quant D

## 2019-05-21 NOTE — H&P (Signed)
Felicia Lame, MD [address] [place], [place] [address] [phone] Fax : [phone]  Primary Care Physician:  Rusty Aus, MD Primary Gastroenterologist:  Dr. Allen Norris  Pre-Procedure History & Physical: HPI:  Felicia Carroll is a 74 y.o. female is here for an colonoscopy.   Past Medical History:  Diagnosis Date  . Allergy   . Cancer ([place]) 2005   melanoma  Olilla removed  . Complication of anesthesia    slow to wake  . Family history of adverse reaction to anesthesia    Father - slow to wake  . GERD (gastroesophageal reflux disease)     Past Surgical History:  Procedure Laterality Date  . CATARACT EXTRACTION  8/ 2011  . EYE SURGERY  2011   cataract,  with lens implant  . LUMBAR LAMINECTOMY  2018   L4-5  . melanoma skin cancer  11/22/03    Prior to Admission medications   Medication Sig Start Date End Date Taking? Authorizing Provider  ASPIRIN 81 PO Take by mouth every other day.   Yes [provider]  Chlorpheniramine-Phenylephrine (SUDAFED PE SINUS/ALLERGY) 4-10 MG per tablet Take 1 tablet by mouth every 4 (four) hours as needed.   Yes [provider]  fluticasone (FLONASE) 50 MCG/ACT nasal spray Place 2 sprays into the nose daily.   Yes [provider]  GARLIC PO Take by mouth daily.   Yes [provider]  magnesium oxide (MAG-OX) 400 MG tablet Take 400 mg by mouth daily.   Yes [provider]  Probiotic Product (PROBIOTIC PO) Take by mouth daily.   Yes [provider]  pyridoxine (B-6) 100 MG tablet Take 100 mg by mouth daily.   Yes [provider]  TURMERIC PO Take by mouth daily.   Yes [provider]  vitamin B-12 (CYANOCOBALAMIN) 1000 MCG tablet Take 1,000 mcg by mouth daily.   Yes [provider]  Vitamin D, Cholecalciferol, 25 MCG (1000 UT) TABS Take by mouth daily.   Yes [provider]  VITAMIN E PO Take by mouth daily.   Yes [provider]  GLUCOSAMINE-CHONDROITIN PO Take by mouth daily.    [provider]    Allergies as of 04/23/2019  . (No Known Allergies)    Family History  Problem Relation Age of Onset  . Cancer Mother   . Arthritis Maternal Grandmother   . Cancer Maternal Grandmother     Social History   Socioeconomic History  . Marital status: Married    Spouse name: Not on file  . Number of children: Not on file  . Years of education: Not on file  . Highest education level: Not on file  Occupational History  . Not on file  Tobacco Use  . Smoking status: Never Smoker  . Smokeless tobacco: Never Used  Substance and Sexual Activity  . Alcohol use: No  . Drug use: No  . Sexual activity: Not on file  Other Topics Concern  . Not on file  Social History Narrative  . Not on file   Social Determinants of Health   Financial Resource Strain:   . Difficulty of Paying Living Expenses:   Food Insecurity:   . Worried About Charity fundraiser in the Last Year:   . Arboriculturist in the Last Year:   Transportation Needs:   . Film/video editor (Medical):   Marland Kitchen Lack of Transportation (Non-Medical):   Physical Activity:   .  Days of Exercise per Week:   . Minutes of Exercise per Session:   Stress:   . Feeling of Stress :   Social Connections:   . Frequency of Communication with Friends and Family:   . Frequency of Social Gatherings with Friends and Family:   . Attends Religious Services:   . Active Member of Clubs or Organizations:   . Attends Archivist Meetings:   Marland Kitchen Marital Status:   Intimate Partner Violence:   . Fear of Current or Ex-Partner:   . Emotionally Abused:   Marland Kitchen Physically Abused:   . Sexually Abused:     Review of Systems: See HPI, otherwise negative ROS  Physical Exam: BP 135/67   Pulse 84   Temp (!) 97.5 F (36.4 C) (Temporal)   Ht 5\' 1"  (1.549 m)   Wt 66.7 kg   SpO2 99%   BMI 27.78 kg/m  General:   Alert,  pleasant and cooperative  in NAD Head:  Normocephalic and atraumatic. Neck:  Supple; no masses or thyromegaly. Lungs:  Clear throughout to auscultation.    Heart:  Regular rate and rhythm. Abdomen:  Soft, nontender and nondistended. Normal bowel sounds, without guarding, and without rebound.   Neurologic:  Alert and  oriented x4;  grossly normal neurologically.  Impression/Plan: [name] is here for an colonoscopy to be performed for positive cologard  Risks, benefits, limitations, and alternatives regarding  colonoscopy have been reviewed with the patient.  Questions have been answered.  All parties agreeable.   Felicia Lame, MD  05/21/2019, 11:13 AM

## 2019-05-21 NOTE — Transfer of Care (Signed)
Immediate Anesthesia Transfer of Care Note  Patient: Felicia Carroll  Procedure(s) Performed: COLONOSCOPY WITH PROPOFOL (N/A Rectum)  Patient Location: PACU  Anesthesia Type: General  Level of Consciousness: awake, alert  and patient cooperative  Airway and Oxygen Therapy: Patient Spontanous Breathing and Patient connected to supplemental oxygen  Post-op Assessment: Post-op Vital signs reviewed, Patient's Cardiovascular Status Stable, Respiratory Function Stable, Patent Airway and No signs of Nausea or vomiting  Post-op Vital Signs: Reviewed and stable  Complications: No apparent anesthesia complications

## 2019-05-21 NOTE — Anesthesia Preprocedure Evaluation (Signed)
Anesthesia Evaluation  Patient identified by MRN, date of birth, ID band Patient awake    Reviewed: Allergy & Precautions, H&P , NPO status , Patient's Chart, lab work & pertinent test results  Airway Mallampati: I  TM Distance: >3 FB Neck ROM: full    Dental no notable dental hx.    Pulmonary neg pulmonary ROS,    Pulmonary exam normal breath sounds clear to auscultation       Cardiovascular negative cardio ROS Normal cardiovascular exam Rhythm:regular Rate:Normal     Neuro/Psych negative neurological ROS     GI/Hepatic Neg liver ROS, Medicated,  Endo/Other  negative endocrine ROS  Renal/GU negative Renal ROS  negative genitourinary   Musculoskeletal   Abdominal   Peds  Hematology negative hematology ROS (+)   Anesthesia Other Findings   Reproductive/Obstetrics                             Anesthesia Physical Anesthesia Plan  ASA: II  Anesthesia Plan: General   Post-op Pain Management:    Induction:   PONV Risk Score and Plan:   Airway Management Planned:   Additional Equipment:   Intra-op Plan:   Post-operative Plan:   Informed Consent: I have reviewed the patients History and Physical, chart, labs and discussed the procedure including the risks, benefits and alternatives for the proposed anesthesia with the patient or authorized representative who has indicated his/her understanding and acceptance.       Plan Discussed with:   Anesthesia Plan Comments:         Anesthesia Quick Evaluation

## 2019-05-22 ENCOUNTER — Encounter: Payer: Self-pay | Admitting: *Deleted

## 2019-08-08 ENCOUNTER — Ambulatory Visit: Payer: Self-pay | Admitting: Dermatology

## 2019-12-03 ENCOUNTER — Other Ambulatory Visit: Payer: Self-pay | Admitting: Internal Medicine

## 2019-12-03 DIAGNOSIS — Z1231 Encounter for screening mammogram for malignant neoplasm of breast: Secondary | ICD-10-CM

## 2019-12-17 ENCOUNTER — Ambulatory Visit
Admission: RE | Admit: 2019-12-17 | Discharge: 2019-12-17 | Disposition: A | Discharge status: Home / Self Care | Payer: Medicare Other | Source: Ambulatory Visit | Attending: Internal Medicine | Admitting: Internal Medicine

## 2019-12-17 ENCOUNTER — Other Ambulatory Visit: Payer: Self-pay

## 2019-12-17 DIAGNOSIS — Z1231 Encounter for screening mammogram for malignant neoplasm of breast: Secondary | ICD-10-CM | POA: Diagnosis not present

## 2021-01-20 IMAGING — MG DIGITAL SCREENING BILAT W/ TOMO W/ CAD
8 series · 8 of 24 positions shown · non-contrast
Comparison: Previous exam(s).

CLINICAL DATA: Screening.

EXAM:
DIGITAL SCREENING BILATERAL MAMMOGRAM WITH TOMO AND CAD

[R CC synth-2D]
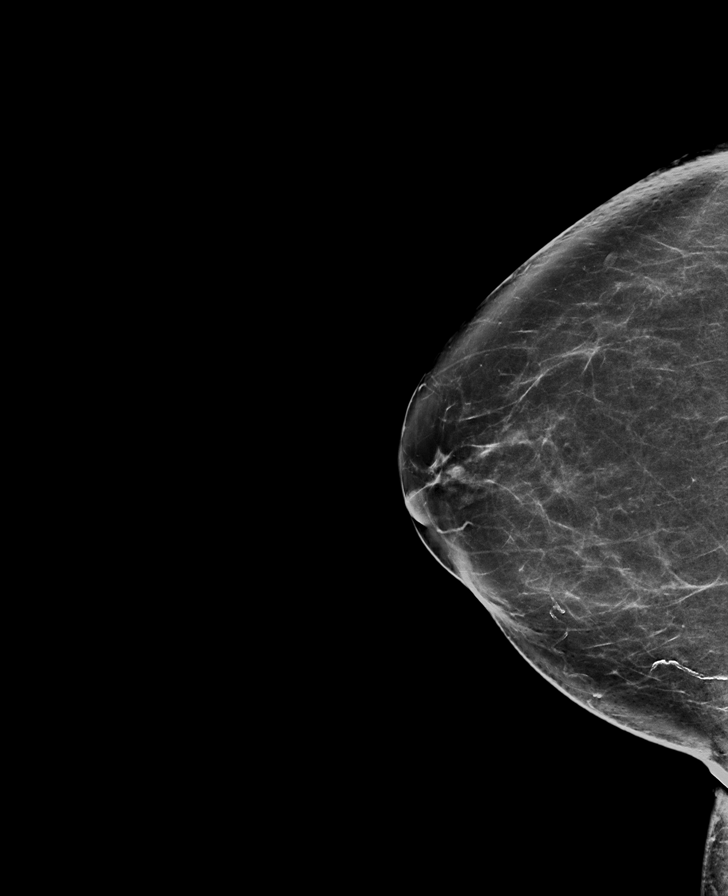

[R MLO synth-2D]
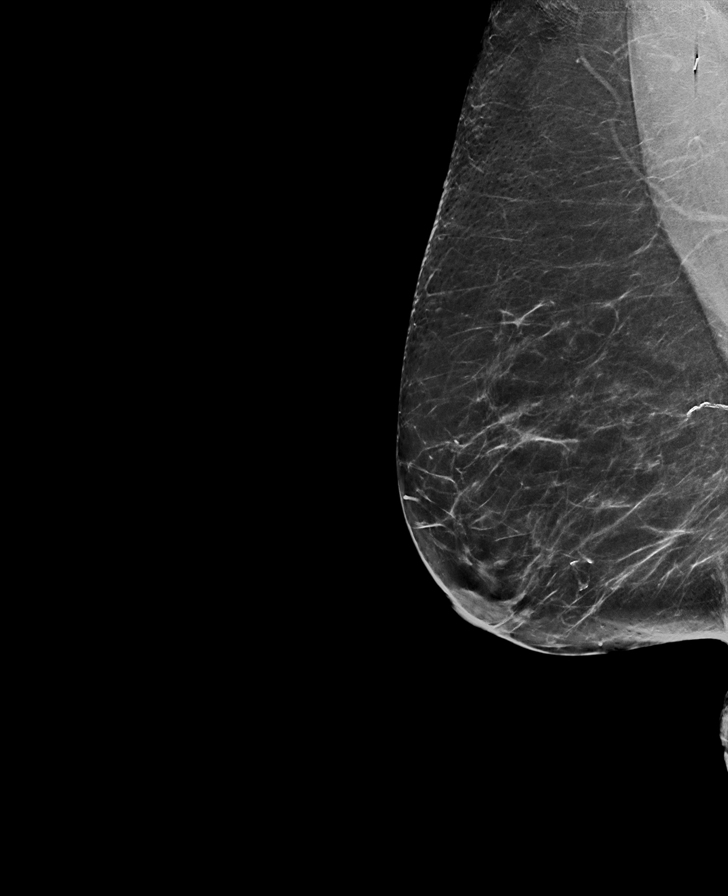

[L CC synth-2D]
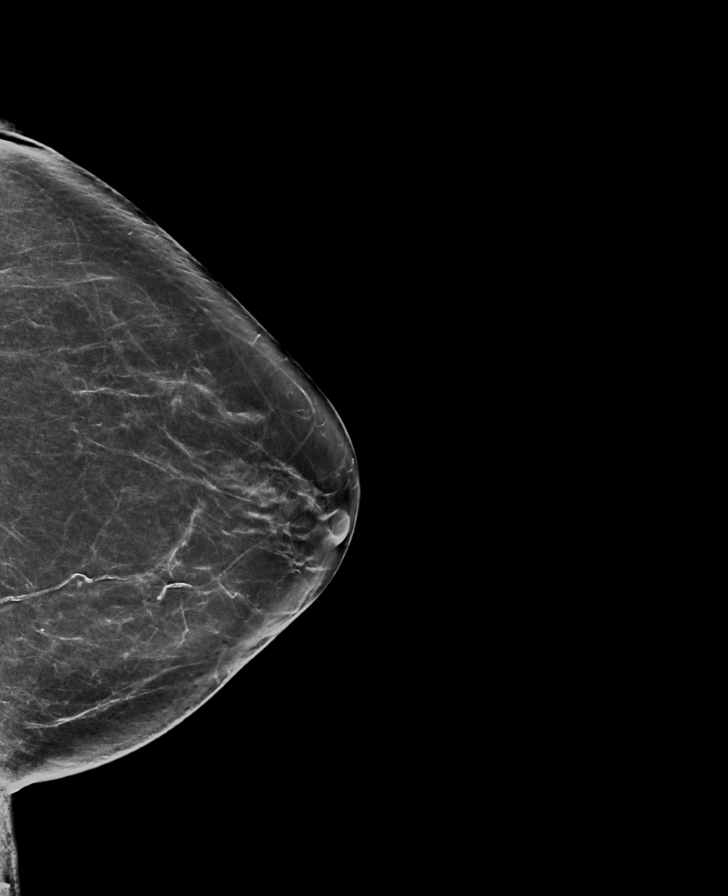

[L MLO synth-2D]
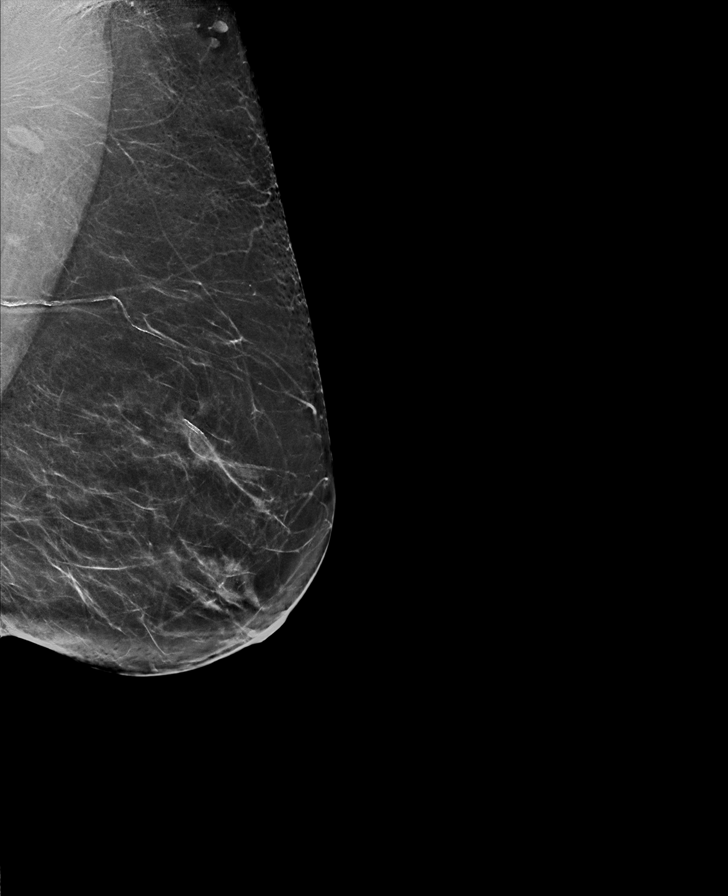

[L MLO tomo · tomo slice 37/73.0]
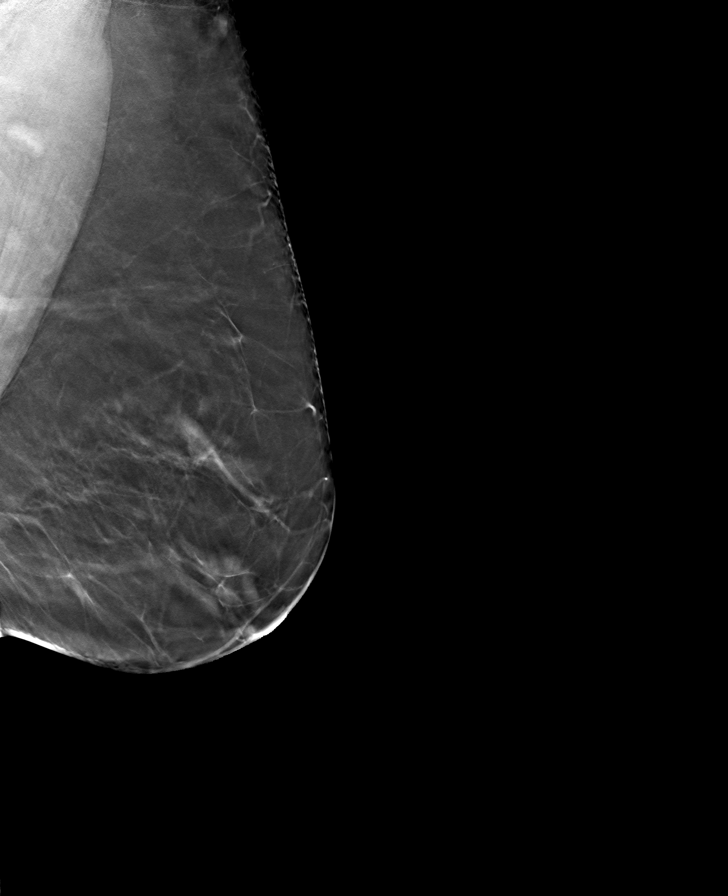

[R CC tomo · tomo slice 39/76.0]
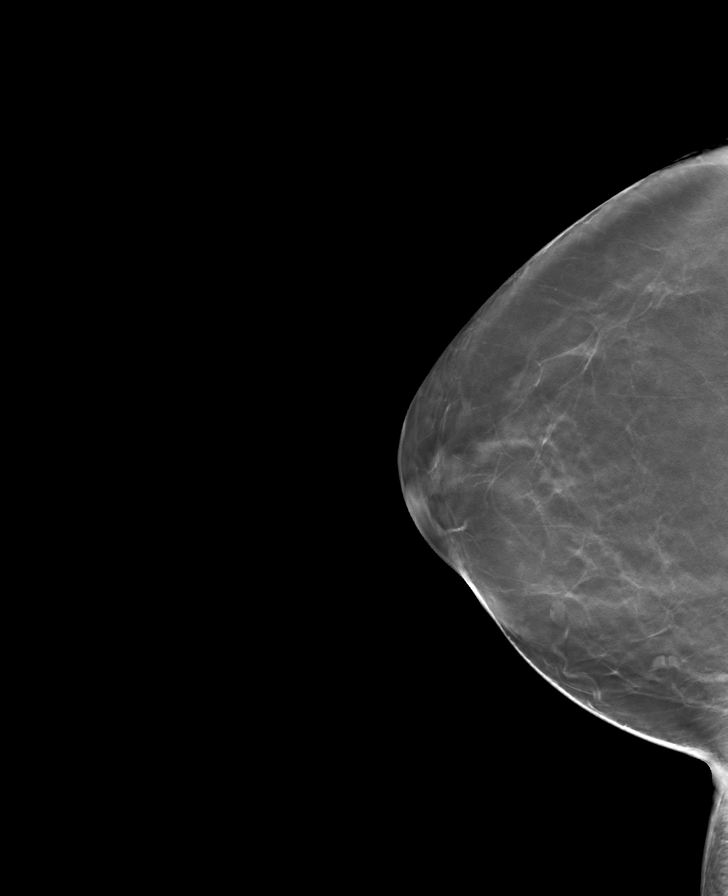

[L CC tomo · tomo slice 35/69.0]
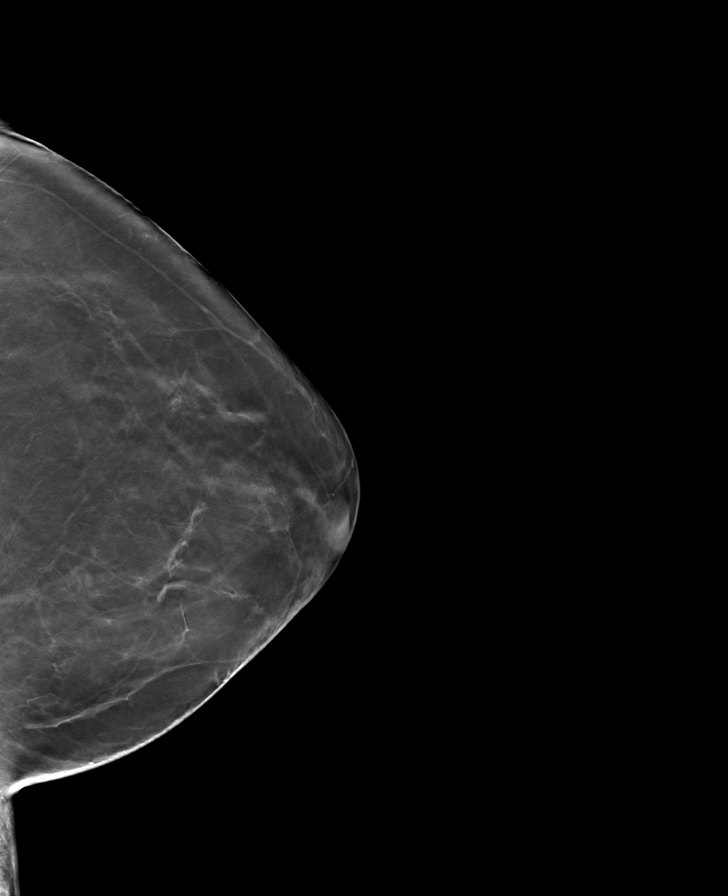

[R MLO tomo · tomo slice 37/73.0]
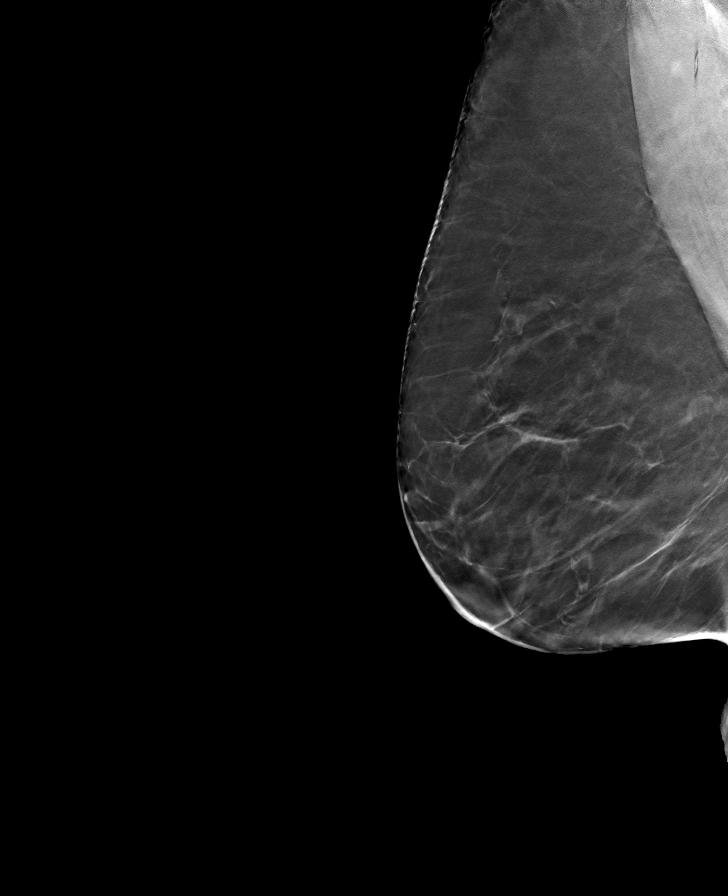

[8 of 24 positions shown; findings below may reference images not displayed]

ACR Breast Density Category b: There are scattered areas of
fibroglandular density.
FINDINGS: There are no findings suspicious for malignancy. Images were
processed with CAD.
IMPRESSION: No mammographic evidence of malignancy. A result letter of this
screening mammogram will be mailed directly to the patient.

RECOMMENDATION:
Screening mammogram in one year. (Code:CN-U-775)

BI-RADS CATEGORY  1: Negative.
